# Patient Record
Sex: Male | Born: 1960 | Race: Black or African American | Hispanic: No | Marital: Married | Smoking: Never smoker
Health system: Southern US, Community
[De-identification: ages and names within clinical notes are randomized; demographics above are authoritative.]

## PROBLEM LIST (undated history)

## (undated) DIAGNOSIS — E119 Type 2 diabetes mellitus without complications: Secondary | ICD-10-CM

## (undated) DIAGNOSIS — E785 Hyperlipidemia, unspecified: Secondary | ICD-10-CM

## (undated) DIAGNOSIS — G4730 Sleep apnea, unspecified: Secondary | ICD-10-CM

## (undated) DIAGNOSIS — E039 Hypothyroidism, unspecified: Secondary | ICD-10-CM

## (undated) HISTORY — DX: Sleep apnea, unspecified: G47.30

## (undated) HISTORY — PX: TONSILECTOMY, ADENOIDECTOMY, BILATERAL MYRINGOTOMY AND TUBES: SHX2538

## (undated) HISTORY — DX: Hyperlipidemia, unspecified: E78.5

## (undated) HISTORY — DX: Type 2 diabetes mellitus without complications: E11.9

## (undated) HISTORY — DX: Hypothyroidism, unspecified: E03.9

---

## 1999-01-07 ENCOUNTER — Ambulatory Visit: Admission: RE | Admit: 1999-01-07 | Discharge: 1999-01-07 | Payer: Self-pay | Admitting: *Deleted

## 1999-05-25 ENCOUNTER — Observation Stay (HOSPITAL_COMMUNITY): Admission: RE | Admit: 1999-05-25 | Discharge: 1999-05-26 | Payer: Self-pay | Admitting: Otolaryngology

## 2006-10-20 ENCOUNTER — Emergency Department (HOSPITAL_COMMUNITY): Admission: EM | Admit: 2006-10-20 | Discharge: 2006-10-20 | Payer: Self-pay | Admitting: Emergency Medicine

## 2006-11-29 ENCOUNTER — Ambulatory Visit: Payer: Self-pay | Admitting: Cardiology

## 2011-11-25 ENCOUNTER — Ambulatory Visit (INDEPENDENT_AMBULATORY_CARE_PROVIDER_SITE_OTHER): Payer: BC Managed Care – PPO | Admitting: Internal Medicine

## 2011-11-25 VITALS — BP 120/79 | HR 67 | Temp 98.0°F | Resp 18 | Ht 66.5 in | Wt 216.2 lb

## 2011-11-25 DIAGNOSIS — H109 Unspecified conjunctivitis: Secondary | ICD-10-CM

## 2011-11-25 DIAGNOSIS — Z1211 Encounter for screening for malignant neoplasm of colon: Secondary | ICD-10-CM

## 2011-11-25 MED ORDER — OFLOXACIN 0.3 % OP SOLN: 1.0000 [drp] | Freq: Four times a day (QID) | OPHTHALMIC | Status: DC

## 2011-11-25 MED ORDER — OFLOXACIN 0.3 % OP SOLN: 1.0000 [drp] | Freq: Four times a day (QID) | OPHTHALMIC | Status: AC

## 2011-11-25 NOTE — Patient Instructions (Signed)
Two drops every 6 hours to both eyes for one week,.  Return for CPE in next 2 months, pt agrees.  Conjunctivitis Conjunctivitis is commonly called "pink eye." Conjunctivitis can be caused by bacterial or viral infection, allergies, or injuries. There is usually redness of the lining of the eye, itching, discomfort, and sometimes discharge. There may be deposits of matter along the eyelids. A viral infection usually causes a watery discharge, while a bacterial infection causes a yellowish, thick discharge. Pink eye is very contagious and spreads by direct contact. You may be given antibiotic eyedrops as part of your treatment. Before using your eye medicine, remove all drainage from the eye by washing gently with warm water and cotton balls. Continue to use the medication until you have awakened 2 mornings in a row without discharge from the eye. Do not rub your eye. This increases the irritation and helps spread infection. Use separate towels from other household members. Wash your hands with soap and water before and after touching your eyes. Use cold compresses to reduce pain and sunglasses to relieve irritation from light. Do not wear contact lenses or wear eye makeup until the infection is gone. SEEK MEDICAL CARE IF:   Your symptoms are not better after 3 days of treatment.   You have increased pain or trouble seeing.   The outer eyelids become very red or swollen.  Document Released: 10/12/2004 Document Revised: 08/24/2011 Document Reviewed: 09/04/2005 [facility] Patient Information 2012 [place], [place].

## 2011-11-25 NOTE — Progress Notes (Signed)
  Subjective:    Patient ID: Kevin Elliott, male    DOB: 15-Mar-1961, 51 y.o.   MRN: [id number]  Conjunctivitis  The current episode started 5 to 7 days ago. The onset was gradual. The problem is mild. The symptoms are relieved by nothing. Associated symptoms include eye itching, eye discharge and eye redness. Pertinent negatives include no decreased vision, no double vision, no congestion, no rhinorrhea, no sore throat and no eye pain.  Kevin Elliott is a newly 51 year old AA male here for pink eye for about a week.  Started in his left eye now his right eye is also having itching, redness, and increased discharge,  He has no other URI symptoms, sore throat, rhinitis, or sinus, ear pain.  He denies any trauma to the eye or vision changes.  Wears glasses for reading.  Works as a Careers information officer.  Wishes to establish a PCP and have screening colonoscopy done.  History of sleep apnea, denies other chronic disease.  Has seen Dr. Brunilda Payor for a prostate exam in the past.    Review of Systems  HENT: Negative for congestion, sore throat and rhinorrhea.   Eyes: Positive for discharge, redness and itching. Negative for double vision and pain.  All other systems reviewed and are negative.       Objective:   Physical Exam  Vitals reviewed. Constitutional: He appears well-developed and well-nourished.  HENT:  Head: Normocephalic.  Right Ear: External ear normal.  Left Ear: External ear normal.  Mouth/Throat: Oropharynx is clear and moist. No oropharyngeal exudate.       Left upper eyelid just slightly swollen.  Conjunctiva injected left more than right.  Right eye without swelling.  Eyes: Conjunctivae are normal.  Neck: Neck supple.  Cardiovascular: Normal rate, regular rhythm and normal heart sounds.   Pulmonary/Chest: Effort normal and breath sounds normal.  Musculoskeletal: He exhibits no edema.  Skin: Skin is warm and dry.  Psychiatric: He has a normal mood and affect. His behavior is normal.    Vision screening reviewed.        Assessment & Plan:  Conjunctivitis:  Ocuflox 2 drops QID for a week.  Screening colonoscopy referred to Dr. Elnoria Howard.  Pt agrees to rtc in 2 months for CPE.  AVS printed

## 2012-02-19 LAB — HM COLONOSCOPY

## 2012-08-10 ENCOUNTER — Ambulatory Visit: Payer: BC Managed Care – PPO

## 2012-08-10 ENCOUNTER — Ambulatory Visit (INDEPENDENT_AMBULATORY_CARE_PROVIDER_SITE_OTHER): Payer: BC Managed Care – PPO | Admitting: Emergency Medicine

## 2012-08-10 VITALS — BP 124/79 | HR 77 | Temp 97.9°F | Resp 18 | Wt 211.0 lb

## 2012-08-10 DIAGNOSIS — M545 Low back pain: Secondary | ICD-10-CM

## 2012-08-10 DIAGNOSIS — R202 Paresthesia of skin: Secondary | ICD-10-CM

## 2012-08-10 DIAGNOSIS — R209 Unspecified disturbances of skin sensation: Secondary | ICD-10-CM

## 2012-08-10 DIAGNOSIS — R200 Anesthesia of skin: Secondary | ICD-10-CM

## 2012-08-10 MED ORDER — HYDROCODONE-ACETAMINOPHEN 5-500 MG PO CAPS: 1.0000 | ORAL_CAPSULE | Freq: Four times a day (QID) | ORAL | Status: DC | PRN

## 2012-08-10 MED ORDER — PREDNISONE 20 MG PO TABS: ORAL_TABLET | ORAL | Status: DC

## 2012-08-10 NOTE — Patient Instructions (Addendum)
Sciatica Sciatica is pain, weakness, numbness, or tingling along the path of the sciatic nerve. The nerve starts in the lower back and runs down the back of each leg. The nerve controls the muscles in the lower leg and in the back of the knee, while also providing sensation to the back of the thigh, lower leg, and the sole of your foot. Sciatica is a symptom of another medical condition. For instance, nerve damage or certain conditions, such as a herniated disk or bone spur on the spine, pinch or put pressure on the sciatic nerve. This causes the pain, weakness, or other sensations normally associated with sciatica. Generally, sciatica only affects one side of the body. CAUSES   Herniated or slipped disc.  Degenerative disk disease.  A pain disorder involving the narrow muscle in the buttocks (piriformis syndrome).  Pelvic injury or fracture.  Pregnancy.  Tumor (rare). SYMPTOMS  Symptoms can vary from mild to very severe. The symptoms usually travel from the low back to the buttocks and down the back of the leg. Symptoms can include:  Mild tingling or dull aches in the lower back, leg, or hip.  Numbness in the back of the calf or sole of the foot.  Burning sensations in the lower back, leg, or hip.  Sharp pains in the lower back, leg, or hip.  Leg weakness.  Severe back pain inhibiting movement. These symptoms may get worse with coughing, sneezing, laughing, or prolonged sitting or standing. Also, being overweight may worsen symptoms. DIAGNOSIS  Your caregiver will perform a physical exam to look for common symptoms of sciatica. He or she may ask you to do certain movements or activities that would trigger sciatic nerve pain. Other tests may be performed to find the cause of the sciatica. These may include:  Blood tests.  X-rays.  Imaging tests, such as an MRI or CT scan. TREATMENT  Treatment is directed at the cause of the sciatic pain. Sometimes, treatment is not necessary  and the pain and discomfort goes away on its own. If treatment is needed, your caregiver may suggest:  Over-the-counter medicines to relieve pain.  Prescription medicines, such as anti-inflammatory medicine, muscle relaxants, or narcotics.  Applying heat or ice to the painful area.  Steroid injections to lessen pain, irritation, and inflammation around the nerve.  Reducing activity during periods of pain.  Exercising and stretching to strengthen your abdomen and improve flexibility of your spine. Your caregiver may suggest losing weight if the extra weight makes the back pain worse.  Physical therapy.  Surgery to eliminate what is pressing or pinching the nerve, such as a bone spur or part of a herniated disk. HOME CARE INSTRUCTIONS   Only take over-the-counter or prescription medicines for pain or discomfort as directed by your caregiver.  Apply ice to the affected area for 20 minutes, 3 4 times a day for the first 48 72 hours. Then try heat in the same way.  Exercise, stretch, or perform your usual activities if these do not aggravate your pain.  Attend physical therapy sessions as directed by your caregiver.  Keep all follow-up appointments as directed by your caregiver.  Do not wear high heels or shoes that do not provide proper support.  Check your mattress to see if it is too soft. A firm mattress may lessen your pain and discomfort. SEEK IMMEDIATE MEDICAL CARE IF:   You lose control of your bowel or bladder (incontinence).  You have increasing weakness in the lower back,   pelvis, buttocks, or legs.  You have redness or swelling of your back.  You have a burning sensation when you urinate.  You have pain that gets worse when you lie down or awakens you at night.  Your pain is worse than you have experienced in the past.  Your pain is lasting longer than 4 weeks.  You are suddenly losing weight without reason. MAKE SURE YOU:  Understand these  instructions.  Will watch your condition.  Will get help right away if you are not doing well or get worse. Document Released: 08/29/2001 Document Revised: 03/05/2012 Document Reviewed: 01/14/2012 [employer] Patient Information 2013 [employer].  

## 2012-08-10 NOTE — Progress Notes (Signed)
  Subjective:    Patient ID: Kevin Elliott, male    DOB: 1960/09/24, 51 y.o.   MRN: [id number]  HPI 51 y.o male presents with lower leg and right foot pain.  Wednesday was transferring 50lb concrete bags- building a fence.  Since this incidence has had numbness and pain on from right side buttock to foot.  Lateral pain with numbness and tingling,specifically 4th/5th digits.  Does feel strain when dong certain moves in back of thigh.  Only position that helps alleviate pain is lying supine with leg extended.     Review of Systems     Objective:   Physical Exam there is mild tenderness over lower lumbar spine. With the patient seated straight leg raising is present at about 60 on the right. Knee reflexes are 2+ and symmetrical the there is an absent right ankle reflex left ankle reflexes 1+. There is weakness noted on either urgent of the right foot compared to the left. There is no other focal weakness noted.  [facility] reading (PRIMARY) by  Dr. Cleta Alberts Narrow L5 S1 .       Assessment & Plan:  I suspect patient has a lumbar radiculopathy involving the S1 nerve root. We'll treat with prednisone taper dose as well as hydrocodone for pain. If patient calls plan to schedule an MRI without contrast to rule out a right S1 radiculopathy.

## 2012-11-26 ENCOUNTER — Encounter: Payer: BC Managed Care – PPO | Attending: Internal Medicine | Admitting: *Deleted

## 2012-11-26 DIAGNOSIS — E119 Type 2 diabetes mellitus without complications: Secondary | ICD-10-CM | POA: Insufficient documentation

## 2012-11-26 DIAGNOSIS — Z713 Dietary counseling and surveillance: Secondary | ICD-10-CM | POA: Insufficient documentation

## 2012-11-29 ENCOUNTER — Encounter: Payer: Self-pay | Admitting: *Deleted

## 2012-11-29 NOTE — Progress Notes (Signed)
Patient was seen on 11/26/2012 for the first of a series of three diabetes self-management courses at the [facility]. Patient's most recent A1c was 6.6 % on 10/07/12 The following learning objectives were met by the patient during this course:   Defines the role of glucose and insulin  Identifies type of diabetes and pathophysiology  Defines the diagnostic criteria for diabetes and prediabetes  States the risk factors for Type 2 Diabetes  States the symptoms of Type 2 Diabetes  Defines Type 2 Diabetes treatment goals  Defines Type 2 Diabetes treatment options  States the rationale for glucose monitoring  Identifies A1C, glucose targets, and testing times  Identifies proper sharps disposal  Defines the purpose of a diabetes food plan  Identifies carbohydrate food groups  Defines effects of carbohydrate foods on glucose levels  Identifies carbohydrate choices/grams/food labels  States benefits of physical activity and effect on glucose  Review of suggested activity guidelines  Handouts given during class include:  Type 2 Diabetes: Basics Book  My Food Plan Book  Food and Activity Log   Follow-Up Plan: Core Class 2

## 2012-11-29 NOTE — Patient Instructions (Signed)
Goals:  Follow Diabetes Meal Plan as instructed  Eat 3 meals and 2 snacks, every 3-5 hrs  Limit carbohydrate intake to 45-60 grams carbohydrate/meal  Limit carbohydrate intake to 30 grams carbohydrate/snack  Add lean protein foods to meals/snacks  Monitor glucose levels as instructed by your doctor  Aim for 30 mins of physical activity daily  Bring food record and glucose log to your next nutrition visit   

## 2012-12-03 ENCOUNTER — Ambulatory Visit: Payer: BC Managed Care – PPO

## 2012-12-17 ENCOUNTER — Ambulatory Visit: Payer: BC Managed Care – PPO

## 2012-12-19 ENCOUNTER — Encounter: Payer: BC Managed Care – PPO | Attending: Internal Medicine | Admitting: Dietician

## 2012-12-19 DIAGNOSIS — E119 Type 2 diabetes mellitus without complications: Secondary | ICD-10-CM

## 2012-12-19 DIAGNOSIS — Z713 Dietary counseling and surveillance: Secondary | ICD-10-CM | POA: Insufficient documentation

## 2012-12-21 NOTE — Progress Notes (Signed)
  Patient was seen on 12/19/2012 for the second of a series of three diabetes self-management courses at the [facility]. The following learning objectives were met by the patient during this course:   Explain basic nutrition maintenance and quality assurance  Describe causes, symptoms and treatment of hypoglycemia and hyperglycemia  Explain how to manage diabetes during illness  Describe the importance of good nutrition for health and healthy eating strategies  List strategies to follow meal plan when dining out  Describe the effects of alcohol on glucose and how to use it safely  Describe problem solving skills for day-to-day glucose challenges  Describe strategies to use when treatment plan needs to change  Identify important factors involved in successful weight loss  Describe ways to remain physically active  Describe the impact of regular activity on insulin resistance   Handouts given in class:  Refrigerator magnet for Sick Day Guidelines  [facility] Oral medication/insulin handout  Weight Loss Handout  Follow-Up Plan: Patient will attend the final class of the ADA Diabetes Self-Care Education.   

## 2013-01-02 ENCOUNTER — Encounter: Payer: BC Managed Care – PPO | Admitting: *Deleted

## 2013-01-02 DIAGNOSIS — E119 Type 2 diabetes mellitus without complications: Secondary | ICD-10-CM

## 2013-01-02 NOTE — Progress Notes (Signed)
  Patient was seen on 01/02/13 for the third of a series of three diabetes self-management courses at the [facility]. The following learning objectives were met by the patient during this course:    Describe how diabetes changes over time   Identify diabetes complications and ways to prevent them   Describe strategies that can promote heart health including lowering blood pressure and cholesterol   Describe strategies to lower dietary fat and sodium in the diet   Identify physical activities that benefit cardiovascular health   Evaluate success in meeting personal goal   Describe the belief that they can live successfully with diabetes day to day   Establish 2-3 goals that they will plan to diligently work on until they return for the free 54-month follow-up visit  The following handouts were given in class:  3 Month Follow Up Visit handout  Goal setting handout  Class evaluation form  Your patient has established the following 3 month goals for diabetes self-care:  Count carbohydrates at most meals and snacks   Follow-Up Plan: Patient will attend a 3 month follow-up visit for diabetes self-management education.

## 2013-04-25 ENCOUNTER — Ambulatory Visit: Payer: BC Managed Care – PPO | Admitting: *Deleted

## 2013-05-01 ENCOUNTER — Encounter: Payer: Self-pay | Admitting: *Deleted

## 2013-05-01 ENCOUNTER — Encounter: Payer: BC Managed Care – PPO | Attending: Internal Medicine | Admitting: *Deleted

## 2013-05-01 DIAGNOSIS — Z713 Dietary counseling and surveillance: Secondary | ICD-10-CM | POA: Insufficient documentation

## 2013-05-01 DIAGNOSIS — E119 Type 2 diabetes mellitus without complications: Secondary | ICD-10-CM | POA: Insufficient documentation

## 2013-05-01 NOTE — Progress Notes (Signed)
  Patient was seen on 05/01/2013 for their 3 month follow-up as a part of the diabetes self-management courses at the [facility]. The following learning objectives were met by your patient during this course:  Patient self reports the following:  Diabetes control has improved since diabetes self-management training: YES Number of days blood glucose is >200: NONE Last MD appointment for diabetes: April 23, 2013 Changes in treatment plan: ADDED LIPITOR FOR CHOLESTEROL Confidence with ability to manage diabetes: GOOD Areas for improvement with diabetes self-care: NO, HE IS ON TRACK WITH GOOD CONTROL NOW Willingness to participate in diabetes support group: NOT AT THIS TIME  Your patient has established the following 3 month goals for diabetes self-care:  Count carbohydrates at most meals and snacks - ACCOMPLISHED  Please see Diabetes Flow sheet for findings related to patient's self-care. Working out 3-4 times a week Has cut out pasta, pizza, regular sodas since class. Eating more vegetables and fresh fruits now.  Follow-Up Plan: Patient is eligible for a "free" 30 minute diabetes self-care appointment in the next year. Patient to call and schedule for February, 2015.

## 2013-10-30 ENCOUNTER — Ambulatory Visit: Payer: BC Managed Care – PPO | Admitting: *Deleted

## 2014-03-30 ENCOUNTER — Emergency Department (HOSPITAL_COMMUNITY)
Admission: EM | Admit: 2014-03-30 | Discharge: 2014-03-30 | Disposition: A | Discharge status: Home / Self Care | Payer: BC Managed Care – PPO | Attending: Emergency Medicine | Admitting: Emergency Medicine

## 2014-03-30 ENCOUNTER — Encounter (HOSPITAL_COMMUNITY): Payer: Self-pay | Admitting: Emergency Medicine

## 2014-03-30 ENCOUNTER — Emergency Department (HOSPITAL_COMMUNITY): Payer: BC Managed Care – PPO

## 2014-03-30 DIAGNOSIS — E785 Hyperlipidemia, unspecified: Secondary | ICD-10-CM | POA: Insufficient documentation

## 2014-03-30 DIAGNOSIS — E7800 Pure hypercholesterolemia, unspecified: Secondary | ICD-10-CM | POA: Insufficient documentation

## 2014-03-30 DIAGNOSIS — E119 Type 2 diabetes mellitus without complications: Secondary | ICD-10-CM | POA: Insufficient documentation

## 2014-03-30 DIAGNOSIS — Z79899 Other long term (current) drug therapy: Secondary | ICD-10-CM | POA: Insufficient documentation

## 2014-03-30 DIAGNOSIS — E079 Disorder of thyroid, unspecified: Secondary | ICD-10-CM | POA: Insufficient documentation

## 2014-03-30 DIAGNOSIS — Z8669 Personal history of other diseases of the nervous system and sense organs: Secondary | ICD-10-CM | POA: Insufficient documentation

## 2014-03-30 DIAGNOSIS — IMO0002 Reserved for concepts with insufficient information to code with codable children: Secondary | ICD-10-CM | POA: Insufficient documentation

## 2014-03-30 DIAGNOSIS — R0789 Other chest pain: Secondary | ICD-10-CM | POA: Insufficient documentation

## 2014-03-30 LAB — BASIC METABOLIC PANEL
ANION GAP: 11 (ref 5–15)
BUN: 13 mg/dL (ref 6–23)
CALCIUM: 9.5 mg/dL (ref 8.4–10.5)
CHLORIDE: 100 meq/L (ref 96–112)
CO2: 28 mEq/L (ref 19–32)
Creatinine, Ser: 1.19 mg/dL (ref 0.50–1.35)
GFR calc non Af Amer: 69 mL/min — ABNORMAL LOW (ref >90)
GFR, EST AFRICAN AMERICAN: 80 mL/min — AB (ref >90)
Glucose, Bld: 88 mg/dL (ref 70–99)
Potassium: 4 mEq/L (ref 3.7–5.3)
Sodium: 139 mEq/L (ref 137–147)

## 2014-03-30 LAB — I-STAT TROPONIN, ED: Troponin i, poc: 0 ng/mL (ref 0.00–0.08)

## 2014-03-30 LAB — CBC
HCT: 45.1 % (ref 39.0–52.0)
Hemoglobin: 15.6 g/dL (ref 13.0–17.0)
MCH: 29.7 pg (ref 26.0–34.0)
MCHC: 34.6 g/dL (ref 30.0–36.0)
MCV: 85.9 fL (ref 78.0–100.0)
PLATELETS: 180 10*3/uL (ref 150–400)
RBC: 5.25 MIL/uL (ref 4.22–5.81)
RDW: 13.5 % (ref 11.5–15.5)
WBC: 6.7 10*3/uL (ref 4.0–10.5)

## 2014-03-30 MED ORDER — METHOCARBAMOL 500 MG PO TABS: 1000.0000 mg | ORAL_TABLET | Freq: Four times a day (QID) | ORAL | Status: DC

## 2014-03-30 MED ORDER — IBUPROFEN 600 MG PO TABS: 600.0000 mg | ORAL_TABLET | Freq: Four times a day (QID) | ORAL | Status: DC | PRN

## 2014-03-30 NOTE — ED Provider Notes (Signed)
CSN: [id number]     Arrival date & time 03/30/14  1424 History   First MD Initiated Contact with Patient 03/30/14 1748     Chief Complaint  Patient presents with  . Chest Pain     (Consider location/radiation/quality/duration/timing/severity/associated sxs/prior Treatment) HPI Comments: Patients with past history of diabetes, high cholesterol, family history of MI -- presents with complaint of left upper chest pain that began at approximately 3 AM this morning. No radiation of pain. Patient first noticed when he got up to use the bathroom. Pain is made worse when he turns his head to the left. Pain is resolved when he is not moving. He denies nausea, vomiting. He denies shortness of breath or lightheadedness. No palpitations. Patient does light weight lifting exercises 2-3 times a week. No other strenuous activity or acute injury. No treatments prior to arrival. Patient was concerned about his heart given his family history.  The history is provided by the patient.    Past Medical History  Diagnosis Date  . Diabetes mellitus without complication   . Thyroid disease   . Sleep apnea   . Hyperlipidemia    Past Surgical History  Procedure Laterality Date  . Tonsilectomy, adenoidectomy, bilateral myringotomy and tubes     No family history on file. History  Substance Use Topics  . Smoking status: Never Smoker   . Smokeless tobacco: Not on file  . Alcohol Use: No    Review of Systems  Constitutional: Negative for fever and diaphoresis.  Eyes: Negative for redness.  Respiratory: Negative for cough and shortness of breath.   Cardiovascular: Positive for chest pain. Negative for palpitations and leg swelling.  Gastrointestinal: Negative for nausea, vomiting and abdominal pain.  Genitourinary: Negative for dysuria.  Musculoskeletal: Negative for back pain and neck pain.  Skin: Negative for rash.  Neurological: Negative for syncope and light-headedness.    Allergies  Review of  patient's allergies indicates no known allergies.  Home Medications   Prior to Admission medications   Medication Sig Start Date End Date Taking? Authorizing Provider  atorvastatin (LIPITOR) 10 MG tablet Take 10 mg by mouth daily.    Historical Provider, MD  hydrocodone-acetaminophen (LORCET-HD) 5-500 MG per capsule Take 1 capsule by mouth every 6 (six) hours as needed for pain. 08/10/12   Collene Gobble, MD  levothyroxine (SYNTHROID, LEVOTHROID) 88 MCG tablet Take 88 mcg by mouth daily.    Historical Provider, MD  predniSONE (DELTASONE) 20 MG tablet Take 3 a day for 3 days 2 a day for 3 days one a day for 3 days 08/10/12   Collene Gobble, MD  sitaGLIPtan-metformin (JANUMET) 50-500 MG per tablet Take 1 tablet by mouth 2 (two) times daily with a meal.    Historical Provider, MD   BP 115/75  Pulse 71  Temp(Src) 97.9 F (36.6 C) (Oral)  Resp 18  SpO2 100%  Physical Exam  Nursing note and vitals reviewed. Constitutional: He appears well-developed and well-nourished.  HENT:  Head: Normocephalic and atraumatic.  Mouth/Throat: Mucous membranes are normal. Mucous membranes are not dry.  Eyes: Conjunctivae are normal. Right eye exhibits no discharge. Left eye exhibits no discharge.  Neck: Trachea normal and normal range of motion. Neck supple. Normal carotid pulses and no JVD present. No muscular tenderness present. Carotid bruit is not present. No tracheal deviation present.  Cardiovascular: Normal rate, regular rhythm, S1 normal, S2 normal, normal heart sounds and intact distal pulses.  Exam reveals no distant heart sounds and no  decreased pulses.   No murmur heard. Pulmonary/Chest: Effort normal and breath sounds normal. No respiratory distress. He has no wheezes. He exhibits tenderness (L upper chest wall, reproduced with direct palpation of pectoralis and with leftward rotation of head).  Abdominal: Soft. Normal aorta and bowel sounds are normal. There is no tenderness. There is no rebound  and no guarding.  Musculoskeletal: He exhibits no edema.  Neurological: He is alert.  Skin: Skin is warm and dry. He is not diaphoretic. No cyanosis. No pallor.  Psychiatric: He has a normal mood and affect.    ED Course  Procedures (including critical care time) Labs Review Labs Reviewed  BASIC METABOLIC PANEL - Abnormal; Notable for the following:    GFR calc non Af Amer 69 (*)    GFR calc Af Amer 80 (*)    All other components within normal limits  CBC  I-STAT TROPOININ, ED    Imaging Review Dg Chest 2 View  03/30/2014   CLINICAL DATA:  Chest pain.  EXAM: CHEST  2 VIEW  COMPARISON:  None.  FINDINGS: The heart size and mediastinal contours are within normal limits. Both lungs are clear. No pneumothorax or pleural effusion is noted. The visualized skeletal structures are unremarkable.  IMPRESSION: No active cardiopulmonary disease.   Electronically Signed   By: Roque LiasJames  Green M.D.   On: 03/30/2014 15:15     EKG Interpretation None      6:17 PM Patient seen and examined. Work-up reviewed with patient. Fell patient safe for d/c to home with NSAID and muscle relaxer.   Vital signs reviewed and are as follows: Filed Vitals:   03/30/14 1714  BP: 115/75  Pulse: 71  Temp:   Resp: 18   6:52 PM Patient findings and history reviewed with Dr. Rhunette CroftNanavati. OK for d/c to home. Pt states he has PCP f/u in 3 weeks, will call office tomorrow.   Patient was counseled to return with severe chest pain, especially if the pain is crushing or pressure-like and spreads to the arms, back, neck, or jaw, or if they have sweating, nausea, or shortness of breath with the pain. They were encouraged to call 911 with these symptoms.   They were also told to return if their chest pain gets worse and does not go away with rest, they have an attack of chest pain lasting longer than usual despite rest and treatment with the medications their caregiver has prescribed, if they wake from sleep with chest pain or  shortness of breath, if they feel dizzy or faint, if they have chest pain not typical of their usual pain, or if they have any other emergent concerns regarding their health.  The patient verbalized understanding and agreed.    MDM   Final diagnoses:  Musculoskeletal chest pain   Patient with chest tightness that is obviously reproducible as above. Feel patient is low risk for ACS given history (poor story for ACS/MI), negative troponin, normal/unchanged EKG. This is chest wall pain. No concern for PE, dissection. Pt would benefit with close PCP f/u given strong family history and risk factor profile.     Renne CriglerJoshua Ole Lafon, PA-C 03/30/14 [phone]

## 2014-03-30 NOTE — Discharge Instructions (Signed)
Please read and follow all provided instructions.  Your diagnoses today include:  1. Musculoskeletal chest pain     Tests performed today include:  An EKG of your heart - unchanged  A chest x-ray - normal  Cardiac enzymes - a blood test for heart muscle damage, normal  Blood counts and electrolytes  Vital signs. See below for your results today.   Medications prescribed:   Ibuprofen (Motrin, Advil) - anti-inflammatory pain medication  Do not exceed 600mg  ibuprofen every 6 hours, take with food  You have been prescribed an anti-inflammatory medication or NSAID. Take with food. Take smallest effective dose for the shortest duration needed for your pain. Stop taking if you experience stomach pain or vomiting.    Robaxin (methocarbamol) - muscle relaxer medication  DO NOT drive or perform any activities that require you to be awake and alert because this medicine can make you drowsy.   Take any prescribed medications only as directed.  Follow-up instructions: Please follow-up with your primary care provider as soon as you can for further evaluation of your symptoms.   Return instructions:  SEEK IMMEDIATE MEDICAL ATTENTION IF:  You have severe chest pain, especially if the pain is crushing or pressure-like and spreads to the arms, back, neck, or jaw, or if you have sweating, nausea (feeling sick to your stomach), or shortness of breath. THIS IS AN EMERGENCY. Don't wait to see if the pain will go away. Get medical help at once. Call 911 or [phone] (operator). DO NOT drive yourself to the hospital.   Your chest pain gets worse and does not go away with rest.   You have an attack of chest pain lasting longer than usual, despite rest and treatment with the medications your caregiver has prescribed.   You wake from sleep with chest pain or shortness of breath.  You feel dizzy or faint.  You have chest pain not typical of your usual pain for which you originally saw your caregiver.     You have any other emergent concerns regarding your health.  Additional Information: Chest pain comes from many different causes. Your caregiver has diagnosed you as having chest pain that is not specific for one problem, but does not require admission.  You are at low risk for an acute heart condition or other serious illness.   Your vital signs today were: BP 128/77   Pulse 72   Temp(Src) 97.9 F (36.6 C) (Oral)   Resp 18   SpO2 97% If your blood pressure (BP) was elevated above 135/85 this visit, please have this repeated by your doctor within one month. --------------

## 2014-03-30 NOTE — ED Notes (Signed)
PA student at bedside.

## 2014-03-30 NOTE — ED Notes (Signed)
Patient states he started having reproducible L chest pain when he turns head or moves upper body.  Patient denies any other symptoms at this time.

## 2014-03-31 NOTE — ED Provider Notes (Signed)
Medical screening examination/treatment/procedure(s) were performed by non-physician practitioner and as supervising physician I was immediately available for consultation/collaboration.   EKG Interpretation   Date/Time:  Monday March 30 2014 14:27:28 EDT Ventricular Rate:  90 PR Interval:  156 QRS Duration: 82 QT Interval:  342 QTC Calculation: 418 R Axis:   0 Text Interpretation:  Normal sinus rhythm Indeterminate axis Borderline  ECG Confirmed by Rhunette CroftNANAVATI, MD, Janey GentaANKIT [phone]([id number]) on 03/30/2014 6:49:11 PM       Derwood KaplanAnkit Telia Amundson, MD 03/31/14 0131

## 2014-11-10 ENCOUNTER — Ambulatory Visit: Payer: BC Managed Care – PPO | Admitting: Internal Medicine

## 2014-11-10 ENCOUNTER — Encounter: Payer: Self-pay | Admitting: Internal Medicine

## 2014-11-10 ENCOUNTER — Ambulatory Visit (INDEPENDENT_AMBULATORY_CARE_PROVIDER_SITE_OTHER): Payer: BC Managed Care – PPO | Admitting: Internal Medicine

## 2014-11-10 VITALS — BP 128/82 | HR 74 | Temp 98.5°F | Ht 65.0 in | Wt 205.5 lb

## 2014-11-10 DIAGNOSIS — E785 Hyperlipidemia, unspecified: Secondary | ICD-10-CM

## 2014-11-10 DIAGNOSIS — E119 Type 2 diabetes mellitus without complications: Secondary | ICD-10-CM

## 2014-11-10 DIAGNOSIS — E039 Hypothyroidism, unspecified: Secondary | ICD-10-CM

## 2014-11-10 LAB — COMPREHENSIVE METABOLIC PANEL
ALK PHOS: 49 U/L (ref 39–117)
ALT: 24 U/L (ref 0–53)
AST: 27 U/L (ref 0–37)
Albumin: 4.2 g/dL (ref 3.5–5.2)
BUN: 11 mg/dL (ref 6–23)
CO2: 31 meq/L (ref 19–32)
CREATININE: 1.15 mg/dL (ref 0.40–1.50)
Calcium: 9.7 mg/dL (ref 8.4–10.5)
Chloride: 102 mEq/L (ref 96–112)
GFR: 85.51 mL/min (ref >60.00)
Glucose, Bld: 89 mg/dL (ref 70–99)
Potassium: 4.1 mEq/L (ref 3.5–5.1)
Sodium: 137 mEq/L (ref 135–145)
TOTAL PROTEIN: 7.3 g/dL (ref 6.0–8.3)
Total Bilirubin: 0.4 mg/dL (ref 0.2–1.2)

## 2014-11-10 LAB — CBC WITH DIFFERENTIAL/PLATELET
BASOS PCT: 0.8 % (ref 0.0–3.0)
Basophils Absolute: 0 10*3/uL (ref 0.0–0.1)
Eosinophils Absolute: 0.1 10*3/uL (ref 0.0–0.7)
Eosinophils Relative: 1.5 % (ref 0.0–5.0)
HEMATOCRIT: 46.8 % (ref 39.0–52.0)
HEMOGLOBIN: 15.8 g/dL (ref 13.0–17.0)
Lymphocytes Relative: 56 % — ABNORMAL HIGH (ref 12.0–46.0)
Lymphs Abs: 2.5 10*3/uL (ref 0.7–4.0)
MCHC: 33.8 g/dL (ref 30.0–36.0)
MCV: 88.1 fl (ref 78.0–100.0)
MONO ABS: 0.4 10*3/uL (ref 0.1–1.0)
Monocytes Relative: 8.3 % (ref 3.0–12.0)
NEUTROS ABS: 1.5 10*3/uL (ref 1.4–7.7)
Neutrophils Relative %: 33.4 % — ABNORMAL LOW (ref 43.0–77.0)
Platelets: 196 10*3/uL (ref 150.0–400.0)
RBC: 5.32 Mil/uL (ref 4.22–5.81)
RDW: 14.3 % (ref 11.5–15.5)
WBC: 4.5 10*3/uL (ref 4.0–10.5)

## 2014-11-10 LAB — TSH: TSH: 2.19 u[IU]/mL (ref 0.35–4.50)

## 2014-11-10 LAB — LIPID PANEL
CHOL/HDL RATIO: 4
CHOLESTEROL: 189 mg/dL (ref 0–200)
HDL: 45.4 mg/dL (ref >39.00)
LDL CALC: 129 mg/dL — AB (ref 0–99)
NonHDL: 143.6
Triglycerides: 75 mg/dL (ref 0.0–149.0)
VLDL: 15 mg/dL (ref 0.0–40.0)

## 2014-11-10 LAB — MICROALBUMIN / CREATININE URINE RATIO
Creatinine,U: 102.2 mg/dL
Microalb Creat Ratio: 0.7 mg/g (ref 0.0–30.0)
Microalb, Ur: <0.7 mg/dL (ref 0.0–1.9)

## 2014-11-10 LAB — T4, FREE: FREE T4: 1.01 ng/dL (ref 0.60–1.60)

## 2014-11-10 LAB — HEMOGLOBIN A1C: Hgb A1c MFr Bld: 6.1 % (ref 4.6–6.5)

## 2014-11-10 LAB — HM DIABETES FOOT EXAM

## 2014-11-10 MED ORDER — SITAGLIPTIN PHOS-METFORMIN HCL 50-500 MG PO TABS: 1.0000 | ORAL_TABLET | Freq: Two times a day (BID) | ORAL | Status: DC

## 2014-11-10 MED ORDER — LEVOTHYROXINE SODIUM 100 MCG PO TABS: 100.0000 ug | ORAL_TABLET | Freq: Every day | ORAL | Status: DC

## 2014-11-10 NOTE — Assessment & Plan Note (Signed)
Will check labs Urine microal--ACEI if positive Discussed fitness Due for eye exam

## 2014-11-10 NOTE — Progress Notes (Signed)
Pre visit review using our clinic review tool, if applicable. No additional management support is needed unless otherwise documented below in the visit note. 

## 2014-11-10 NOTE — Assessment & Plan Note (Signed)
Mildly elevated in past Doesn't know recent values Discussed statin and recommended---he wants to consider for now

## 2014-11-10 NOTE — Assessment & Plan Note (Signed)
Seems to be euthyroid Will check labs 

## 2014-11-10 NOTE — Progress Notes (Signed)
Subjective:    Patient ID: Kevin Elliott, male    DOB: [birth year], 54 y.o.   MRN: [id number]  HPI  Here to establish care This is closer to his home Had been seeing Dr Gwenette Greetobyn [place] in [place]  Diabetes diagnosed about 2 years Has had diabetic counseling Pretty good with proper eating Overall weight is down from when he was diagnosed Tries to walk or do other exercise as much as possible Keeps up with eye exams--[facility]. appt soon No extremity numbness or pain  Reviewed cholesterol Baseline was 207 Not on meds for this Not excited about starting medication for this  Diagnosed with hypothyroidism On meds for 10 years or so Feels that his replacement is adequate at present  Current Outpatient Prescriptions on File Prior to Visit  Medication Sig Dispense Refill  . levothyroxine (SYNTHROID, LEVOTHROID) 100 MCG tablet Take 100 mcg by mouth daily before breakfast.    . sitaGLIPtan-metformin (JANUMET) 50-500 MG per tablet Take 1 tablet by mouth 2 (two) times daily with a meal.     No current facility-administered medications on file prior to visit.    No Known Allergies  Past Medical History  Diagnosis Date  . Diabetes mellitus type 2, controlled, without complications   . Hypothyroidism   . Sleep apnea   . Hyperlipidemia     Past Surgical History  Procedure Laterality Date  . Tonsilectomy, adenoidectomy, bilateral myringotomy and tubes      Family History  Problem Relation Age of Onset  . Hypertension Mother   . Gout Mother   . Cancer Father   . COPD Father   . Heart disease Brother     just his oldest brother  . Diabetes Neg Hx     History   Social History  . Marital Status: Married    Spouse Name: N/A  . Number of Children: 4  . Years of Education: N/A   Occupational History  . 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 Smoking status: Never Smoker   . Smokeless tobacco: Never Used  . Alcohol Use: No  . Drug Use: No    . Sexual Activity: Not on file   Other Topics Concern  . Not on file   Social History Narrative   Review of Systems  Constitutional: Negative for fatigue and unexpected weight change.  HENT: Negative for dental problem and hearing loss.   Eyes: Negative for visual disturbance.  Respiratory: Negative for cough, chest tightness and shortness of breath.   Cardiovascular: Negative for chest pain, palpitations and leg swelling.  Gastrointestinal: Negative for nausea, vomiting, abdominal pain, constipation and blood in stool.       No heartburn   Endocrine: Negative for polydipsia and polyuria.  Genitourinary: Negative for urgency and frequency.       Nocturia x 1 No daytime problems  Musculoskeletal: Negative for back pain, joint swelling and arthralgias.  Skin: Negative for rash.  Neurological: Negative for dizziness, syncope, weakness, light-headedness and numbness.  Hematological: Negative for adenopathy. Does not bruise/bleed easily.  Psychiatric/Behavioral: Negative for sleep disturbance and dysphoric mood. The patient is not nervous/anxious.        Snoring improved after weight loss       Objective:   Physical Exam  Constitutional: He is oriented to person, place, and time. He appears well-developed and well-nourished. No distress.  HENT:  Head: Normocephalic and atraumatic.  Right Ear: External ear normal.  Left Ear:  External ear normal.  Mouth/Throat: Oropharynx is clear and moist. No oropharyngeal exudate.  Eyes: Conjunctivae and EOM are normal. Pupils are equal, round, and reactive to light.  Neck: Normal range of motion. Neck supple. No thyromegaly present.  Cardiovascular: Normal rate, regular rhythm, normal heart sounds and intact distal pulses.  Exam reveals no gallop.   No murmur heard. Pulmonary/Chest: Effort normal and breath sounds normal. No respiratory distress. He has no wheezes. He has no rales.  Abdominal: Soft. There is no tenderness.  Musculoskeletal:  He exhibits no edema or tenderness.  Lymphadenopathy:    He has no cervical adenopathy.  Neurological: He is alert and oriented to person, place, and time.  Normal sensation on plantar feet No foot lesions  Skin: No rash noted. No erythema.  Psychiatric: He has a normal mood and affect. His behavior is normal.          Assessment & Plan:

## 2014-11-13 ENCOUNTER — Encounter: Payer: Self-pay | Admitting: Internal Medicine

## 2014-11-28 LAB — HM DIABETES EYE EXAM

## 2014-12-02 IMAGING — CR DG CHEST 2V
2 series · 2 of 2 positions shown · non-contrast
Comparison: None.

CLINICAL DATA: Chest pain.

EXAM:
CHEST  2 VIEW

[w chest pa]
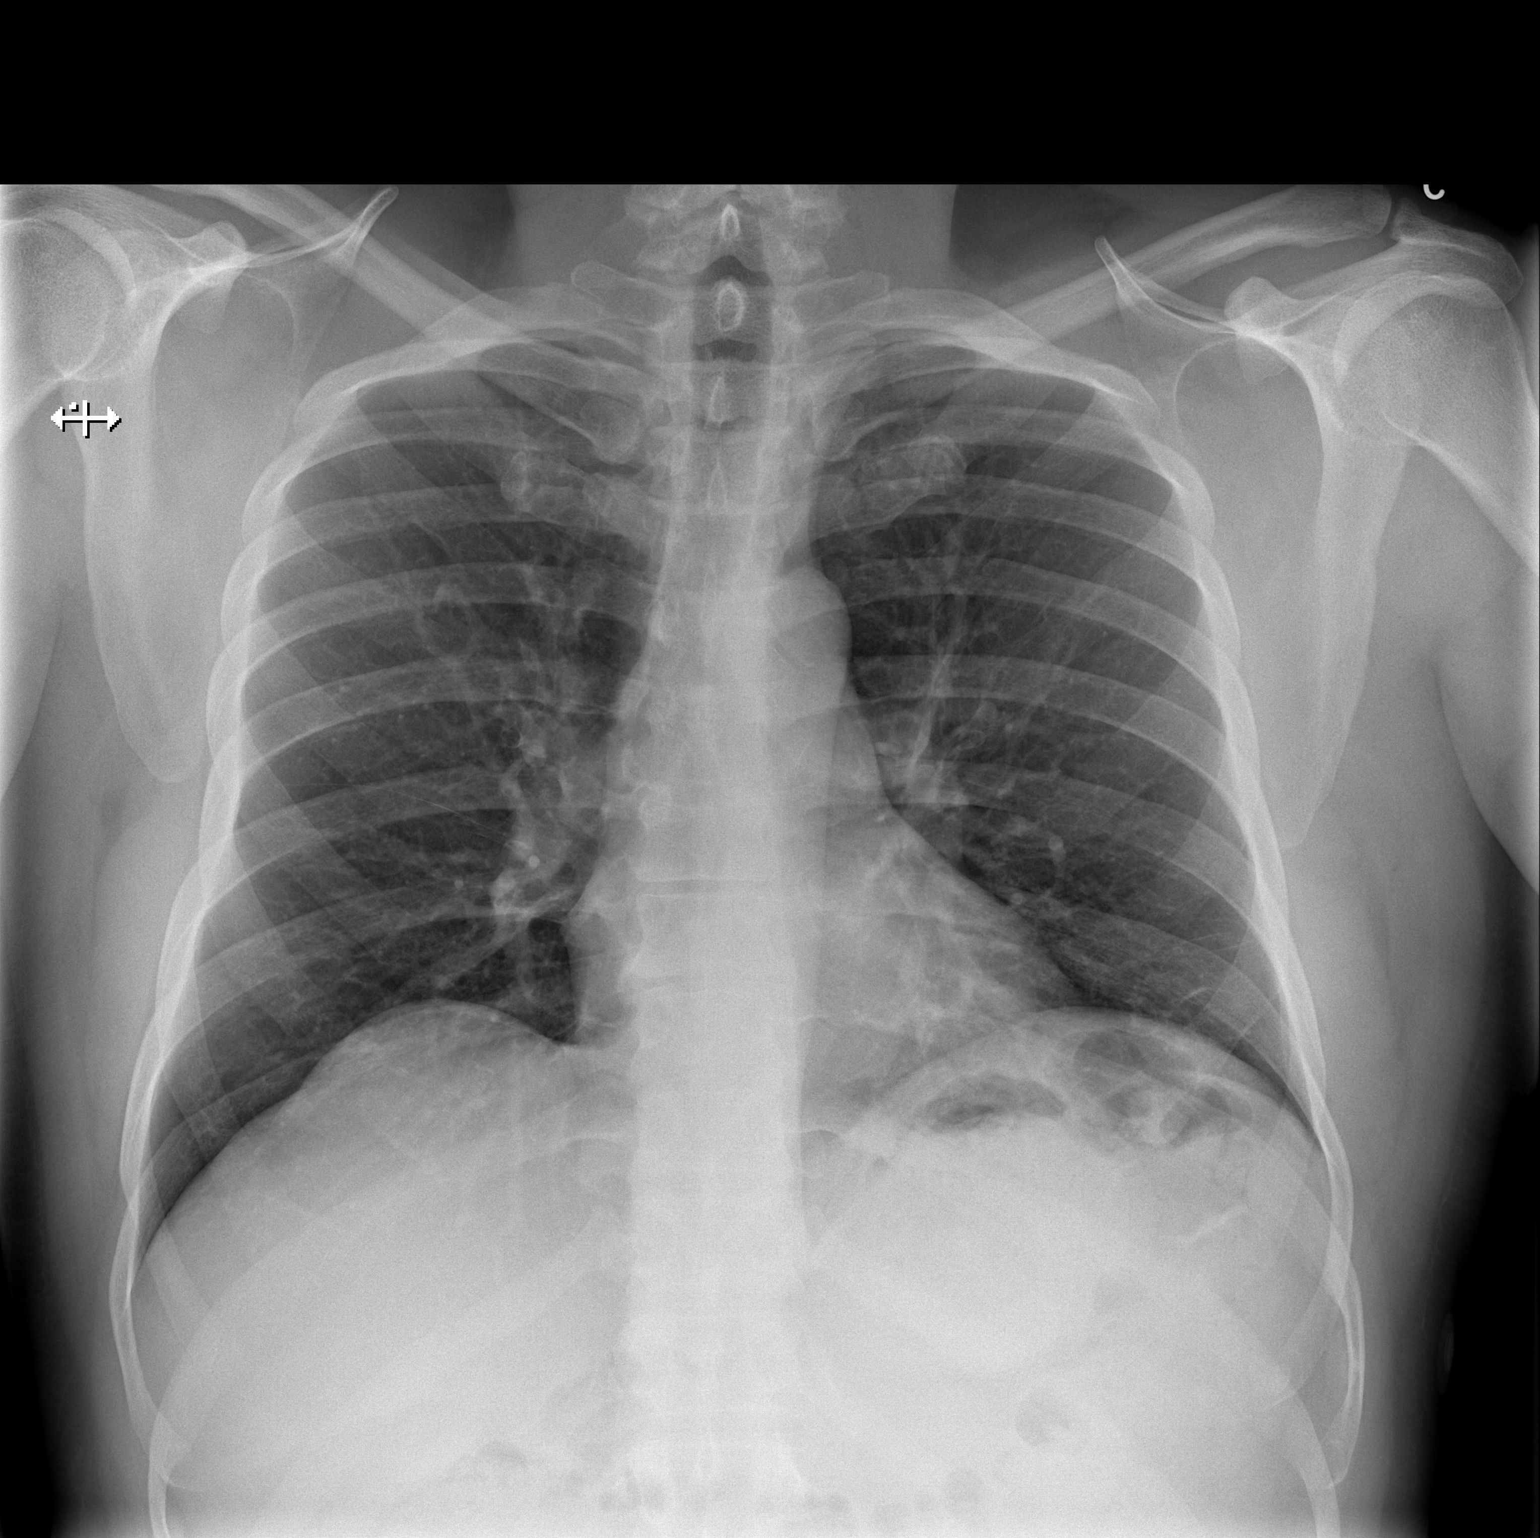

[w chest lat]
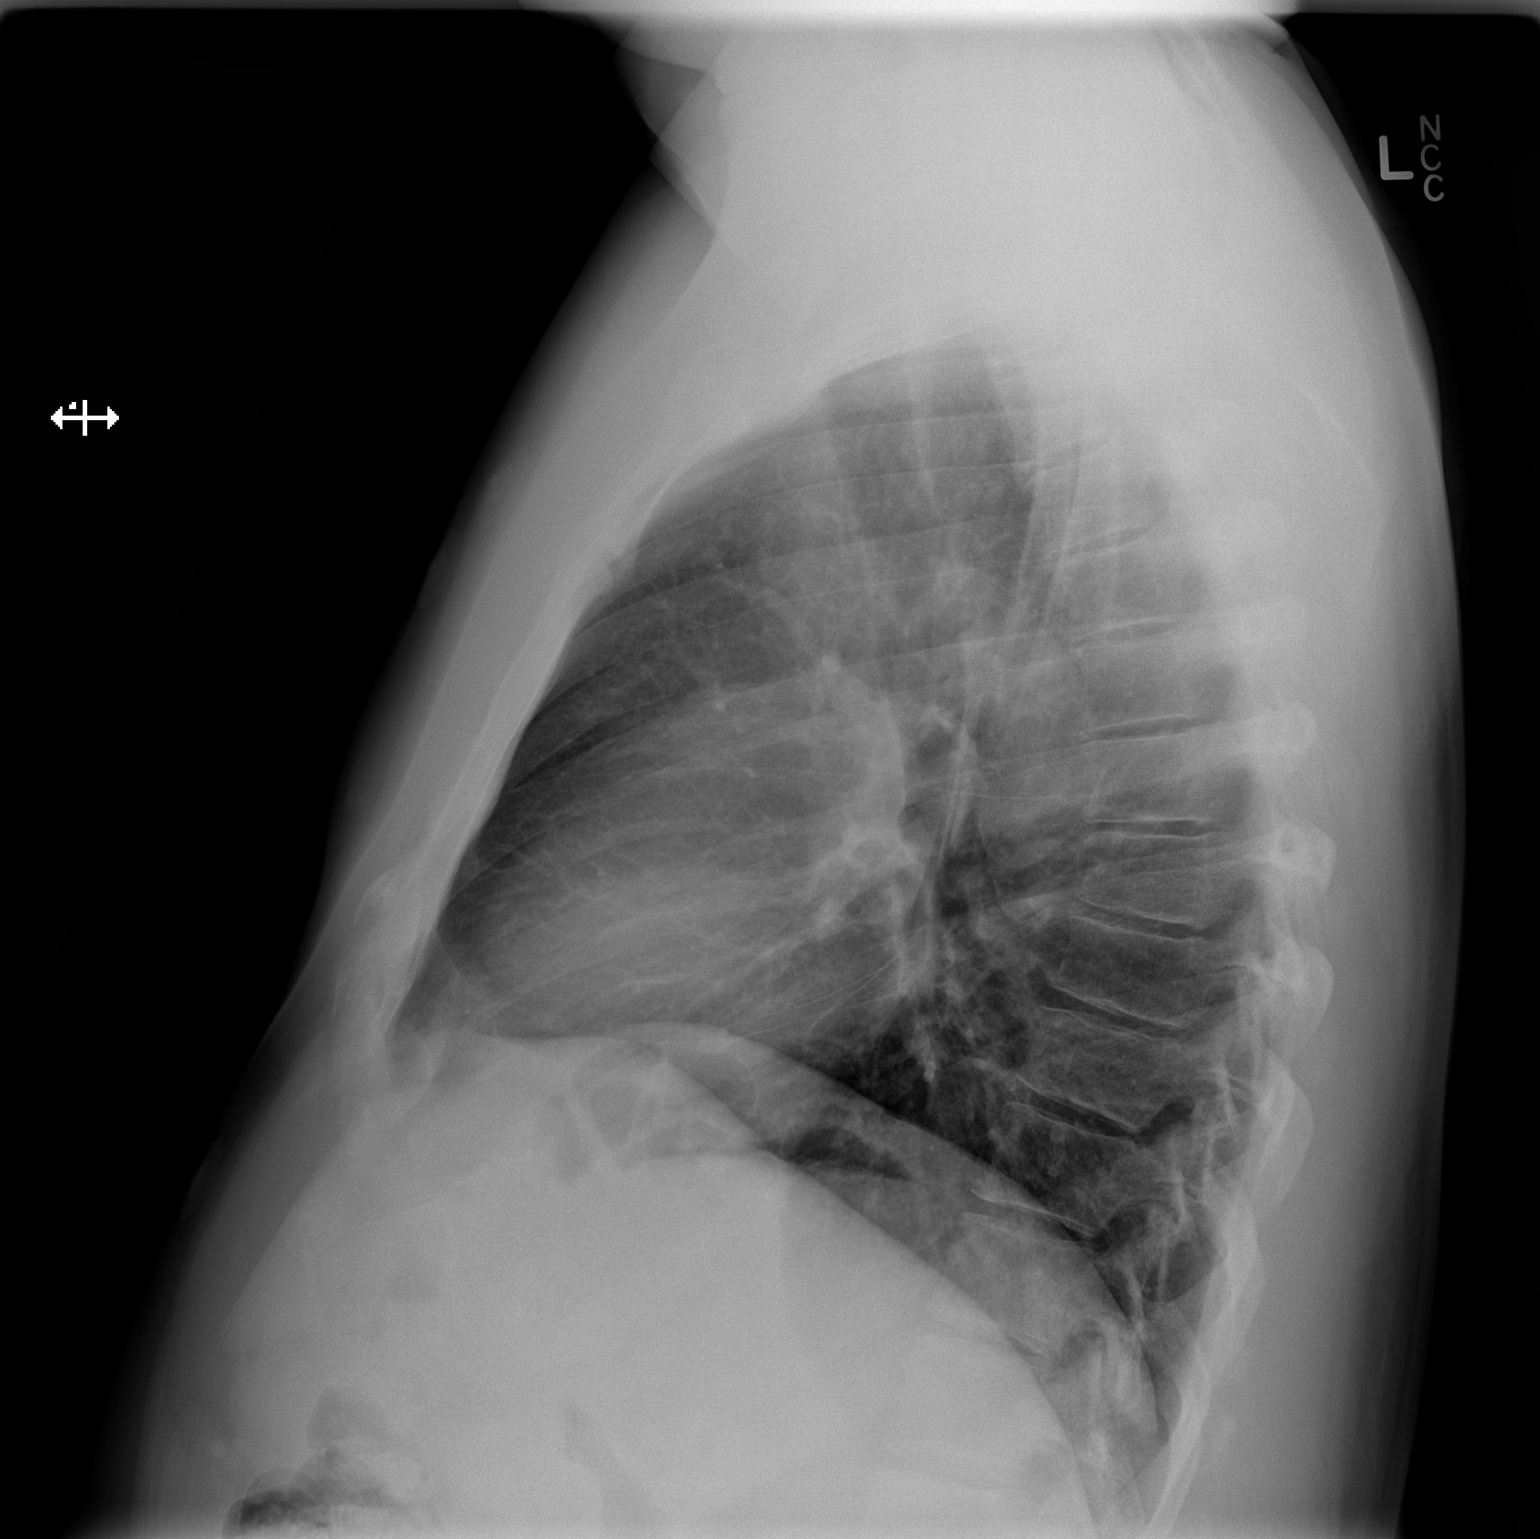

[2 of 2 positions shown; findings below may reference images not displayed]

FINDINGS: The heart size and mediastinal contours are within normal limits.
Both lungs are clear. No pneumothorax or pleural effusion is noted.
The visualized skeletal structures are unremarkable.
IMPRESSION: No active cardiopulmonary disease.

## 2015-05-13 ENCOUNTER — Encounter: Payer: Self-pay | Admitting: Internal Medicine

## 2015-05-13 ENCOUNTER — Ambulatory Visit (INDEPENDENT_AMBULATORY_CARE_PROVIDER_SITE_OTHER): Payer: BC Managed Care – PPO | Admitting: Internal Medicine

## 2015-05-13 VITALS — BP 130/70 | HR 78 | Temp 98.0°F | Ht 65.0 in | Wt 204.0 lb

## 2015-05-13 DIAGNOSIS — E785 Hyperlipidemia, unspecified: Secondary | ICD-10-CM

## 2015-05-13 DIAGNOSIS — Z0000 Encounter for general adult medical examination without abnormal findings: Secondary | ICD-10-CM

## 2015-05-13 DIAGNOSIS — Z23 Encounter for immunization: Secondary | ICD-10-CM | POA: Diagnosis not present

## 2015-05-13 DIAGNOSIS — E119 Type 2 diabetes mellitus without complications: Secondary | ICD-10-CM | POA: Diagnosis not present

## 2015-05-13 LAB — HM DIABETES FOOT EXAM

## 2015-05-13 MED ORDER — ATORVASTATIN CALCIUM 10 MG PO TABS: 10.0000 mg | ORAL_TABLET | Freq: Every day | ORAL | Status: DC

## 2015-05-13 NOTE — Progress Notes (Signed)
Pre visit review using our clinic review tool, if applicable. No additional management support is needed unless otherwise documented below in the visit note. 

## 2015-05-13 NOTE — Addendum Note (Signed)
Addended by: Sueanne Margarita on: 05/13/2015 04:51 PM   Modules accepted: Orders

## 2015-05-13 NOTE — Assessment & Plan Note (Signed)
He is reluctant to try medication Will try low dose atorvastatin

## 2015-05-13 NOTE — Assessment & Plan Note (Signed)
Good control  No changes needed

## 2015-05-13 NOTE — Progress Notes (Signed)
Subjective:    Patient ID: Kevin Elliott, male    DOB: [birth year], 54 y.o.   MRN: [id number]  HPI Here for physical   Checks sugars every morning Range is 78-85 fasting No hypoglycemic reactions Lost 2# since last visit Tries to walk regularly and some weight work No numbness or pain in feet Eye exam in March  Still on thyroid med  Current Outpatient Prescriptions on File Prior to Visit  Medication Sig Dispense Refill  . Cholecalciferol (VITAMIN D) 2000 UNITS CAPS Take by mouth. Take 2 by mouth daily    . levothyroxine (SYNTHROID, LEVOTHROID) 100 MCG tablet Take 1 tablet (100 mcg total) by mouth daily before breakfast. 90 tablet 3  . sitaGLIPtin-metformin (JANUMET) 50-500 MG per tablet Take 1 tablet by mouth 2 (two) times daily with a meal. 180 tablet 3   No current facility-administered medications on file prior to visit.    No Known Allergies  Past Medical History  Diagnosis Date  . Diabetes mellitus type 2, controlled, without complications   . Hypothyroidism   . Sleep apnea   . Hyperlipidemia     Past Surgical History  Procedure Laterality Date  . Tonsilectomy, adenoidectomy, bilateral myringotomy and tubes      Family History  Problem Relation Age of Onset  . Hypertension Mother   . Gout Mother   . Cancer Father   . COPD Father   . Heart disease Brother     just his oldest brother  . Diabetes Neg Hx     Social History   Social History  . Marital Status: Married    Spouse Name: N/A  . Number of Children: 4  . Years of Education: N/A   Occupational History  . [employer] examiner     Retired   Social History Main Topics  . Smoking status: Never Smoker   . Smokeless tobacco: Never Used  . Alcohol Use: No  . Drug Use: No  . Sexual Activity: Not on file   Other Topics Concern  . Not on file   Social History Narrative   Review of Systems  Constitutional: Negative for fatigue and unexpected weight change.       Wears seat belt  HENT:  Negative for dental problem, hearing loss and tinnitus.        Keeps up with dentist  Eyes: Negative for visual disturbance.       No diplopia or unilateral vision loss  Respiratory: Negative for cough, chest tightness and shortness of breath.   Cardiovascular: Negative for chest pain, palpitations and leg swelling.  Gastrointestinal: Negative for nausea, vomiting, abdominal pain, constipation and blood in stool.       No heartburn  Endocrine: Negative for polydipsia and polyuria.  Genitourinary: Positive for difficulty urinating.       Some decreased stream No sexual problems  Musculoskeletal: Negative for back pain, joint swelling and arthralgias.  Skin: Negative for rash.       No suspicious lesions  Allergic/Immunologic: Negative for environmental allergies and immunocompromised state.  Neurological: Negative for dizziness, syncope, weakness, light-headedness, numbness and headaches.  Hematological: Negative for adenopathy. Does not bruise/bleed easily.  Psychiatric/Behavioral: Negative for sleep disturbance and dysphoric mood. The patient is not nervous/anxious.        Objective:   Physical Exam  Constitutional: He is oriented to person, place, and time. He appears well-developed and well-nourished. No distress.  HENT:  Head: Normocephalic and atraumatic.  Right Ear: External ear normal.  Left Ear: External ear normal.  Mouth/Throat: Oropharynx is clear and moist. No oropharyngeal exudate.  Eyes: Conjunctivae and EOM are normal. Pupils are equal, round, and reactive to light.  Neck: Normal range of motion. Neck supple. No thyromegaly present.  Cardiovascular: Normal rate, regular rhythm, normal heart sounds and intact distal pulses.  Exam reveals no gallop.   No murmur heard. Pulmonary/Chest: Effort normal and breath sounds normal. No respiratory distress. He has no wheezes. He has no rales.  Abdominal: There is no tenderness.  Musculoskeletal: He exhibits no edema or  tenderness.  Lymphadenopathy:    He has no cervical adenopathy.  Neurological: He is alert and oriented to person, place, and time.  Normal sensation in feet  Skin: No rash noted. No erythema.  No foot lesions  Psychiatric: He has a normal mood and affect. His behavior is normal.          Assessment & Plan:

## 2015-05-13 NOTE — Assessment & Plan Note (Signed)
Healthy Will check PSA after check Colon due 2018 Flu/prevnar

## 2015-06-25 ENCOUNTER — Other Ambulatory Visit: Payer: BC Managed Care – PPO

## 2015-07-02 ENCOUNTER — Other Ambulatory Visit (INDEPENDENT_AMBULATORY_CARE_PROVIDER_SITE_OTHER): Payer: BC Managed Care – PPO

## 2015-07-02 DIAGNOSIS — E119 Type 2 diabetes mellitus without complications: Secondary | ICD-10-CM | POA: Diagnosis not present

## 2015-07-02 DIAGNOSIS — E785 Hyperlipidemia, unspecified: Secondary | ICD-10-CM | POA: Diagnosis not present

## 2015-07-02 LAB — HEMOGLOBIN A1C: Hgb A1c MFr Bld: 6.1 % (ref 4.6–6.5)

## 2015-07-02 LAB — HEPATIC FUNCTION PANEL
ALBUMIN: 4.1 g/dL (ref 3.5–5.2)
ALT: 26 U/L (ref 0–53)
AST: 26 U/L (ref 0–37)
Alkaline Phosphatase: 50 U/L (ref 39–117)
Bilirubin, Direct: 0.2 mg/dL (ref 0.0–0.3)
Total Bilirubin: 0.8 mg/dL (ref 0.2–1.2)
Total Protein: 7.2 g/dL (ref 6.0–8.3)

## 2015-07-02 LAB — LIPID PANEL
Cholesterol: 118 mg/dL (ref 0–200)
HDL: 37.3 mg/dL — AB (ref >39.00)
LDL Cholesterol: 70 mg/dL (ref 0–99)
NONHDL: 80.81
Total CHOL/HDL Ratio: 3
Triglycerides: 52 mg/dL (ref 0.0–149.0)
VLDL: 10.4 mg/dL (ref 0.0–40.0)

## 2015-11-15 ENCOUNTER — Ambulatory Visit (INDEPENDENT_AMBULATORY_CARE_PROVIDER_SITE_OTHER): Payer: BC Managed Care – PPO | Admitting: Internal Medicine

## 2015-11-15 ENCOUNTER — Encounter: Payer: Self-pay | Admitting: Internal Medicine

## 2015-11-15 VITALS — BP 100/70 | HR 97 | Temp 97.4°F | Wt 197.0 lb

## 2015-11-15 DIAGNOSIS — E785 Hyperlipidemia, unspecified: Secondary | ICD-10-CM | POA: Diagnosis not present

## 2015-11-15 DIAGNOSIS — E119 Type 2 diabetes mellitus without complications: Secondary | ICD-10-CM | POA: Diagnosis not present

## 2015-11-15 NOTE — Assessment & Plan Note (Signed)
Tolerating statin without problems

## 2015-11-15 NOTE — Progress Notes (Signed)
   Subjective:    Patient ID: Kevin Elliott, male    DOB: [birth year], 55 y.o.   MRN: [id number]  HPI Here for follow up of diabetes and other medical conditions  Doing well Checks sugars 3-4 times a week--fasting Usually 70-85 No hypoglycemic reactions No sores, numbness or pain in feet  No chest pain No dizziness or syncope Breathing is good Exercises regularly at [facility]--- treadmill and walks track  Current Outpatient Prescriptions on File Prior to Visit  Medication Sig Dispense Refill  . atorvastatin (LIPITOR) 10 MG tablet Take 1 tablet (10 mg total) by mouth daily. 90 tablet 3  . Cholecalciferol (VITAMIN D) 2000 UNITS CAPS Take by mouth. Take 2 by mouth daily    . levothyroxine (SYNTHROID, LEVOTHROID) 100 MCG tablet Take 1 tablet (100 mcg total) by mouth daily before breakfast. 90 tablet 3  . sitaGLIPtin-metformin (JANUMET) 50-500 MG per tablet Take 1 tablet by mouth 2 (two) times daily with a meal. 180 tablet 3   No current facility-administered medications on file prior to visit.    No Known Allergies  Past Medical History  Diagnosis Date  . Diabetes mellitus type 2, controlled, without complications (HCC)   . Hypothyroidism   . Sleep apnea   . Hyperlipidemia     Past Surgical History  Procedure Laterality Date  . Tonsilectomy, adenoidectomy, bilateral myringotomy and tubes      Family History  Problem Relation Age of Onset  . Hypertension Mother   . Gout Mother   . Cancer Father   . COPD Father   . Heart disease Brother     just his oldest brother  . Diabetes Neg Hx     Social History   Social History  . Marital Status: Married    Spouse Name: N/A  . Number of Children: 4  . Years of Education: N/A   Occupational History  . [employer] examiner     Retired   Social History Main Topics  . Smoking status: Never Smoker   . Smokeless tobacco: Never Used  . Alcohol Use: No  . Drug Use: No  . Sexual Activity: Not on file   Other Topics Concern    . Not on file   Social History Narrative   Review of Systems  Sleeps well Appetite is fine Weight down 7# since last visit No problems with statin     Objective:   Physical Exam  Constitutional: He appears well-developed and well-nourished. No distress.  Neck: Normal range of motion. Neck supple. No thyromegaly present.  Cardiovascular: Normal rate, regular rhythm, normal heart sounds and intact distal pulses.  Exam reveals no gallop.   No murmur heard. Pulmonary/Chest: Effort normal and breath sounds normal. No respiratory distress. He has no wheezes. He has no rales.  Musculoskeletal: He exhibits no edema or tenderness.  Lymphadenopathy:    He has no cervical adenopathy.  Skin: No rash noted. No erythema.  No foot lesions  Psychiatric: He has a normal mood and affect. His behavior is normal.          Assessment & Plan:

## 2015-11-15 NOTE — Assessment & Plan Note (Signed)
Seems to have excellent control Will check A1c 

## 2015-11-15 NOTE — Progress Notes (Signed)
Pre visit review using our clinic review tool, if applicable. No additional management support is needed unless otherwise documented below in the visit note. 

## 2015-11-16 LAB — RENAL FUNCTION PANEL
ALBUMIN: 4.3 g/dL (ref 3.5–5.2)
BUN: 17 mg/dL (ref 6–23)
CHLORIDE: 102 meq/L (ref 96–112)
CO2: 31 meq/L (ref 19–32)
Calcium: 9.9 mg/dL (ref 8.4–10.5)
Creatinine, Ser: 1.3 mg/dL (ref 0.40–1.50)
GFR: 73.95 mL/min (ref >60.00)
GLUCOSE: 104 mg/dL — AB (ref 70–99)
PHOSPHORUS: 3.3 mg/dL (ref 2.3–4.6)
POTASSIUM: 3.8 meq/L (ref 3.5–5.1)
SODIUM: 139 meq/L (ref 135–145)

## 2015-11-16 LAB — HEMOGLOBIN A1C: Hgb A1c MFr Bld: 6 % (ref 4.6–6.5)

## 2015-12-11 ENCOUNTER — Other Ambulatory Visit: Payer: Self-pay | Admitting: Internal Medicine

## 2016-04-18 ENCOUNTER — Encounter: Payer: Self-pay | Admitting: Internal Medicine

## 2016-04-18 ENCOUNTER — Ambulatory Visit (INDEPENDENT_AMBULATORY_CARE_PROVIDER_SITE_OTHER): Payer: BC Managed Care – PPO | Admitting: Internal Medicine

## 2016-04-18 VITALS — BP 112/78 | HR 70 | Temp 97.5°F | Wt 194.8 lb

## 2016-04-18 DIAGNOSIS — M25512 Pain in left shoulder: Secondary | ICD-10-CM | POA: Diagnosis not present

## 2016-04-18 DIAGNOSIS — R202 Paresthesia of skin: Secondary | ICD-10-CM

## 2016-04-18 MED ORDER — PREDNISONE 10 MG PO TABS: ORAL_TABLET | ORAL | 0 refills | Status: DC

## 2016-04-18 NOTE — Patient Instructions (Signed)
Shoulder Range of Motion Exercises Shoulder range of motion (ROM) exercises are designed to keep the shoulder moving freely. They are often recommended for people who have shoulder pain. MOVEMENT EXERCISE When you are able, do this exercise 5-6 days per week, or as told by your health care provider. Work toward doing 2 sets of 10 swings. Pendulum Exercise How To Do This Exercise Lying Down 1. Lie face-down on a bed with your abdomen close to the side of the bed. 2. Let your arm hang over the side of the bed. 3. Relax your shoulder, arm, and hand. 4. Slowly and gently swing your arm forward and back. Do not use your neck muscles to swing your arm. They should be relaxed. If you are struggling to swing your arm, have someone gently swing it for you. When you do this exercise for the first time, swing your arm at a 15 degree angle for 15 seconds, or swing your arm 10 times. As pain lessens over time, increase the angle of the swing to 30-45 degrees. 5. Repeat steps 1-4 with the other arm. How To Do This Exercise While Standing 1. Stand next to a sturdy chair or table and hold on to it with your hand.  Bend forward at the waist.  Bend your knees slightly.  Relax your other arm and let it hang limp.  Relax the shoulder blade of the arm that is hanging and let it drop.  While keeping your shoulder relaxed, use body motion to swing your arm in small circles. The first time you do this exercise, swing your arm for about 30 seconds or 10 times. When you do it next time, swing your arm for a little longer.  Stand up tall and relax.  Repeat steps 1-7, this time changing the direction of the circles. 2. Repeat steps 1-8 with the other arm. STRETCHING EXERCISES Do these exercises 3-4 times per day on 5-6 days per week or as told by your health care provider. Work toward holding the stretch for 20 seconds. Stretching Exercise 1 1. Lift your arm straight out in front of you. 2. Bend your arm 90  degrees at the elbow (right angle) so your forearm goes across your body and looks like the letter "L." 3. Use your other arm to gently pull the elbow forward and across your body. 4. Repeat steps 1-3 with the other arm. Stretching Exercise 2 You will need a towel or rope for this exercise. 1. Bend one arm behind your back with the palm facing outward. 2. Hold a towel with your other hand. 3. Reach the arm that holds the towel above your head, and bend that arm at the elbow. Your wrist should be behind your neck. 4. Use your free hand to grab the free end of the towel. 5. With the higher hand, gently pull the towel up behind you. 6. With the lower hand, pull the towel down behind you. 7. Repeat steps 1-6 with the other arm. STRENGTHENING EXERCISES Do each of these exercises at four different times of day (sessions) every day or as told by your health care provider. To begin with, repeat each exercise 5 times (repetitions). Work toward doing 3 sets of 12 repetitions or as told by your health care provider. Strengthening Exercise 1 You will need a light weight for this activity. As you grow stronger, you may use a heavier weight. 1. Standing with a weight in your hand, lift your arm straight out to the side   until it is at the same height as your shoulder. 2. Bend your arm at 90 degrees so that your fingers are pointing to the ceiling. 3. Slowly raise your hand until your arm is straight up in the air. 4. Repeat steps 1-3 with the other arm. Strengthening Exercise 2 You will need a light weight for this activity. As you grow stronger, you may use a heavier weight. 1. Standing with a weight in your hand, gradually move your straight arm in an arc, starting at your side, then out in front of you, then straight up over your head. 2. Gradually move your other arm in an arc, starting at your side, then out in front of you, then straight up over your head. 3. Repeat steps 1-2 with the other  arm. Strengthening Exercise 3 You will need an elastic band for this activity. As you grow stronger, gradually increase the size of the bands or increase the number of bands that you use at one time. 1. While standing, hold an elastic band in one hand and raise that arm up in the air. 2. With your other hand, pull down the band until that hand is by your side. 3. Repeat steps 1-2 with the other arm.   This information is not intended to replace advice given to you by your health care provider. Make sure you discuss any questions you have with your health care provider.   Document Released: 06/03/2003 Document Revised: 01/19/2015 Document Reviewed: 08/31/2014 Elsevier Interactive Patient Education 2016 [employer]  

## 2016-04-18 NOTE — Progress Notes (Signed)
Subjective:    Patient ID: Kevin Elliott, male    DOB: Apr 27, 1961, 55 y.o.   MRN: [id number]  HPI  Pt presents to the clinic today with c/o left shoulder pain. He reports this started 2 weeks ago. He describes the pain as constant achy, but it can be sharp with certain movements. The pain seems worse when his arm is hanging down by his side or when he lays on it. It feels better when he lays his arm across his chest. He does have intermittent numbness and tingling radiating down to his fingers. He denies any injury to the area but was outside doing some light yardwork prior to onset. He has taken Tylenol, Ibuprofen and Aspercream with minimal relief.  Review of Systems      Past Medical History:  Diagnosis Date  . Diabetes mellitus type 2, controlled, without complications (HCC)   . Hyperlipidemia   . Hypothyroidism   . Sleep apnea     Current Outpatient Prescriptions  Medication Sig Dispense Refill  . atorvastatin (LIPITOR) 10 MG tablet Take 1 tablet (10 mg total) by mouth daily. 90 tablet 3  . Cholecalciferol (VITAMIN D) 2000 UNITS CAPS Take by mouth. Take 2 by mouth daily    . JANUMET 50-500 MG tablet TAKE 1 TABLET BY MOUTH TWICE DAILY WITH A MEAL 60 tablet 5  . SYNTHROID 100 MCG tablet TAKE 1 TABLET BY MOUTH DAILY BEFORE BREAKFAST 120 tablet 1  . predniSONE (DELTASONE) 10 MG tablet Take 3 tabs on days 1-3, take 2 tabs on days 4-6, take 1 tab on days 7-9 18 tablet 0   No current facility-administered medications for this visit.     No Known Allergies  Family History  Problem Relation Age of Onset  . Hypertension Mother   . Gout Mother   . Cancer Father   . COPD Father   . Heart disease Brother     just his oldest brother  . Diabetes Neg Hx     Social History   Social History  . Marital status: Married    Spouse name: N/A  . Number of children: 4  . Years of education: N/A   Occupational History  . [employer] examiner     Retired   Social History Main  Topics  . Smoking status: Never Smoker  . Smokeless tobacco: Never Used  . Alcohol use No  . Drug use: No  . Sexual activity: Not on file   Other Topics Concern  . Not on file   Social History Narrative  . No narrative on file     Constitutional: Denies fever, malaise, fatigue, headache or abrupt weight changes.  Musculoskeletal: Pt reports left shoulder pain. Denies decrease in range of motion, difficulty with gait, muscle pain or joint swelling.  Skin: Denies redness, rashes, lesions or ulcercations.  Neurological: Pt reports numbness and tingling in his left arm. Denies dizziness, difficulty with memory, difficulty with speech or problems with balance and coordination.    No other specific complaints in a complete review of systems (except as listed in HPI above).  Objective:   Physical Exam  BP 112/78   Pulse 70   Temp 97.5 F (36.4 C) (Oral)   Wt 194 lb 12 oz (88.3 kg)   SpO2 98%   BMI 32.41 kg/m  Wt Readings from Last 3 Encounters:  04/18/16 194 lb 12 oz (88.3 kg)  11/15/15 197 lb (89.4 kg)  05/13/15 204 lb (92.5 kg)  General: Appears  His stated age, well developed, well nourished in NAD. Cardiovascular: Radial pulses 2+. Musculoskeletal: Normal flexion, extension and rotation of the cervical spine. Pain with rotation to the left. No bony tenderness noted over the cervical spine. Normal internal, external rotation of the left shoulder. No pain with palpation of the shoulder joint. Pain with palpation of the biceps tendon. Strength 5/5 BUE, hand grips equal. Neurological: Alert and oriented. Sensation intact to BUE.  BMET    Component Value Date/Time   NA 139 11/15/2015 1641   K 3.8 11/15/2015 1641   CL 102 11/15/2015 1641   CO2 31 11/15/2015 1641   GLUCOSE 104 (H) 11/15/2015 1641   BUN 17 11/15/2015 1641   CREATININE 1.30 11/15/2015 1641   CALCIUM 9.9 11/15/2015 1641   GFRNONAA 69 (L) 03/30/2014 1436   GFRAA 80 (L) 03/30/2014 1436    Lipid Panel      Component Value Date/Time   CHOL 118 07/02/2015 1007   TRIG 52.0 07/02/2015 1007   HDL 37.30 (L) 07/02/2015 1007   CHOLHDL 3 07/02/2015 1007   VLDL 10.4 07/02/2015 1007   LDLCALC 70 07/02/2015 1007    CBC    Component Value Date/Time   WBC 4.5 11/10/2014 1256   RBC 5.32 11/10/2014 1256   HGB 15.8 11/10/2014 1256   HCT 46.8 11/10/2014 1256   PLT 196.0 11/10/2014 1256   MCV 88.1 11/10/2014 1256   MCH 29.7 03/30/2014 1436   MCHC 33.8 11/10/2014 1256   RDW 14.3 11/10/2014 1256   LYMPHSABS 2.5 11/10/2014 1256   MONOABS 0.4 11/10/2014 1256   EOSABS 0.1 11/10/2014 1256   BASOSABS 0.0 11/10/2014 1256    Hgb A1C Lab Results  Component Value Date   HGBA1C 6.0 11/15/2015            Assessment & Plan:   Left shoulder pain, paresthesia of left arm:  Likely biceps tendonitis He asked for xray, advised him to hold off at this time eRx for Pred Taper x 9 days- monitor blood sugars (DM 2) Stretching exercises given Sling given for comfort If persist or worsens, consider MRI  RTC as needed Nicki Reaper, NP

## 2016-05-02 ENCOUNTER — Encounter: Payer: Self-pay | Admitting: Internal Medicine

## 2016-05-02 ENCOUNTER — Ambulatory Visit (INDEPENDENT_AMBULATORY_CARE_PROVIDER_SITE_OTHER): Payer: BC Managed Care – PPO | Admitting: Internal Medicine

## 2016-05-02 DIAGNOSIS — S46312A Strain of muscle, fascia and tendon of triceps, left arm, initial encounter: Secondary | ICD-10-CM | POA: Insufficient documentation

## 2016-05-02 DIAGNOSIS — S46312D Strain of muscle, fascia and tendon of triceps, left arm, subsequent encounter: Secondary | ICD-10-CM | POA: Diagnosis not present

## 2016-05-02 NOTE — Progress Notes (Signed)
   Subjective:    Patient ID: Kevin Elliott, male    DOB: 04-Mar-1961, 55 y.o.   MRN: [id number]  HPI Here for follow up of shoulder problems Reviewed visit from 2 weeks  Still with pain--though not as severe If he pulls his left arm across his chest--he gets puling and burning down arm laterally Soreness bad with any prolonged holding of weight Soreness mostly posterior and lateral No problems lifting shoulder Only a problem when he hand is hanging down  Did take the steroids Using ibuprofen 200mg  daily or bid at most Rubbed it down with aspercreme Tingling/numbness if lying on that side too long  Current Outpatient Prescriptions on File Prior to Visit  Medication Sig Dispense Refill  . atorvastatin (LIPITOR) 10 MG tablet Take 1 tablet (10 mg total) by mouth daily. 90 tablet 3  . Cholecalciferol (VITAMIN D) 2000 UNITS CAPS Take by mouth. Take 2 by mouth daily    . JANUMET 50-500 MG tablet TAKE 1 TABLET BY MOUTH TWICE DAILY WITH A MEAL 60 tablet 5  . SYNTHROID 100 MCG tablet TAKE 1 TABLET BY MOUTH DAILY BEFORE BREAKFAST 120 tablet 1   No current facility-administered medications on file prior to visit.     No Known Allergies  Past Medical History:  Diagnosis Date  . Diabetes mellitus type 2, controlled, without complications (HCC)   . Hyperlipidemia   . Hypothyroidism   . Sleep apnea     Past Surgical History:  Procedure Laterality Date  . TONSILECTOMY, ADENOIDECTOMY, BILATERAL MYRINGOTOMY AND TUBES      Family History  Problem Relation Age of Onset  . Hypertension Mother   . Gout Mother   . Cancer Father   . COPD Father   . Heart disease Brother     just his oldest brother  . Diabetes Neg Hx     Social History   Social History  . Marital status: Married    Spouse name: N/A  . Number of children: 4  . Years of education: N/A   Occupational History  . 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 Smoking status: Never Smoker  .  Smokeless tobacco: Never Used  . Alcohol use No  . Drug use: No  . Sexual activity: Not on file   Other Topics Concern  . Not on file   Social History Narrative  . No narrative on file   Review of Systems No known injury No weight lifting--only typical yard work    Objective:   Physical Exam  Musculoskeletal:  Left shoulder--no swelling Normal active ROM No crepitus Tender at insertion of triceps No arm weakness No bursa tenderness          Assessment & Plan:

## 2016-05-02 NOTE — Patient Instructions (Signed)
Please try ice on the painful area regularly over the next 1-2 weeks. Try ibuprofen 400mg  (2 tabs) three times a day for the next 1-2 weeks If you are no better in 2-3 weeks, I will set you up with an orthopedist.

## 2016-05-02 NOTE — Progress Notes (Signed)
Pre visit review using our clinic review tool, if applicable. No additional management support is needed unless otherwise documented below in the visit note. 

## 2016-05-02 NOTE — Assessment & Plan Note (Signed)
Ongoing pain and trouble using the arm Has clear tendonitis but no loss of function Discussed ice, NSAID regularly for 1-2 weeks and avoid upper body weights during that time

## 2016-06-07 ENCOUNTER — Other Ambulatory Visit: Payer: Self-pay | Admitting: Internal Medicine

## 2016-06-12 ENCOUNTER — Encounter: Payer: BC Managed Care – PPO | Admitting: Internal Medicine

## 2016-06-13 ENCOUNTER — Encounter: Payer: Self-pay | Admitting: Internal Medicine

## 2016-06-13 ENCOUNTER — Ambulatory Visit (INDEPENDENT_AMBULATORY_CARE_PROVIDER_SITE_OTHER): Payer: BC Managed Care – PPO | Admitting: Internal Medicine

## 2016-06-13 VITALS — BP 108/70 | HR 73 | Temp 97.4°F | Ht 66.0 in | Wt 195.2 lb

## 2016-06-13 DIAGNOSIS — E119 Type 2 diabetes mellitus without complications: Secondary | ICD-10-CM

## 2016-06-13 DIAGNOSIS — Z0000 Encounter for general adult medical examination without abnormal findings: Secondary | ICD-10-CM | POA: Diagnosis not present

## 2016-06-13 DIAGNOSIS — Z23 Encounter for immunization: Secondary | ICD-10-CM

## 2016-06-13 DIAGNOSIS — E039 Hypothyroidism, unspecified: Secondary | ICD-10-CM

## 2016-06-13 DIAGNOSIS — Z125 Encounter for screening for malignant neoplasm of prostate: Secondary | ICD-10-CM

## 2016-06-13 DIAGNOSIS — E785 Hyperlipidemia, unspecified: Secondary | ICD-10-CM

## 2016-06-13 LAB — COMPREHENSIVE METABOLIC PANEL
ALT: 22 U/L (ref 0–53)
AST: 23 U/L (ref 0–37)
Albumin: 4 g/dL (ref 3.5–5.2)
Alkaline Phosphatase: 55 U/L (ref 39–117)
BILIRUBIN TOTAL: 0.6 mg/dL (ref 0.2–1.2)
BUN: 16 mg/dL (ref 6–23)
CHLORIDE: 103 meq/L (ref 96–112)
CO2: 33 meq/L — AB (ref 19–32)
CREATININE: 1.32 mg/dL (ref 0.40–1.50)
Calcium: 9.5 mg/dL (ref 8.4–10.5)
GFR: 72.5 mL/min (ref >60.00)
Glucose, Bld: 78 mg/dL (ref 70–99)
Potassium: 3.9 mEq/L (ref 3.5–5.1)
SODIUM: 142 meq/L (ref 135–145)
Total Protein: 6.7 g/dL (ref 6.0–8.3)

## 2016-06-13 LAB — CBC WITH DIFFERENTIAL/PLATELET
BASOS ABS: 0 10*3/uL (ref 0.0–0.1)
BASOS PCT: 0.4 % (ref 0.0–3.0)
EOS ABS: 0.2 10*3/uL (ref 0.0–0.7)
Eosinophils Relative: 3.3 % (ref 0.0–5.0)
HEMATOCRIT: 45.2 % (ref 39.0–52.0)
Hemoglobin: 15.3 g/dL (ref 13.0–17.0)
LYMPHS PCT: 54.7 % — AB (ref 12.0–46.0)
Lymphs Abs: 2.5 10*3/uL (ref 0.7–4.0)
MCHC: 33.9 g/dL (ref 30.0–36.0)
MCV: 88.6 fl (ref 78.0–100.0)
MONO ABS: 0.5 10*3/uL (ref 0.1–1.0)
Monocytes Relative: 11.5 % (ref 3.0–12.0)
NEUTROS ABS: 1.4 10*3/uL (ref 1.4–7.7)
Neutrophils Relative %: 30.1 % — ABNORMAL LOW (ref 43.0–77.0)
PLATELETS: 173 10*3/uL (ref 150.0–400.0)
RBC: 5.11 Mil/uL (ref 4.22–5.81)
RDW: 14.5 % (ref 11.5–15.5)
WBC: 4.6 10*3/uL (ref 4.0–10.5)

## 2016-06-13 LAB — HEMOGLOBIN A1C: HEMOGLOBIN A1C: 6.1 % (ref 4.6–6.5)

## 2016-06-13 LAB — T4, FREE: Free T4: 0.81 ng/dL (ref 0.60–1.60)

## 2016-06-13 LAB — PSA: PSA: 0.79 ng/mL (ref 0.10–4.00)

## 2016-06-13 LAB — LIPID PANEL
CHOL/HDL RATIO: 3
CHOLESTEROL: 110 mg/dL (ref 0–200)
HDL: 42.2 mg/dL (ref >39.00)
LDL Cholesterol: 49 mg/dL (ref 0–99)
NONHDL: 67.39
Triglycerides: 92 mg/dL (ref 0.0–149.0)
VLDL: 18.4 mg/dL (ref 0.0–40.0)

## 2016-06-13 LAB — MICROALBUMIN / CREATININE URINE RATIO
CREATININE, U: 100.4 mg/dL
MICROALB/CREAT RATIO: 0.7 mg/g (ref 0.0–30.0)

## 2016-06-13 LAB — TSH: TSH: 2.69 u[IU]/mL (ref 0.35–4.50)

## 2016-06-13 NOTE — Addendum Note (Signed)
Addended by: Eual FinesBRIDGES, Maecyn Panning P on: 06/13/2016 12:44 PM   Modules accepted: Orders

## 2016-06-13 NOTE — Assessment & Plan Note (Signed)
Seems euthyroid 

## 2016-06-13 NOTE — Assessment & Plan Note (Signed)
Sugars are in normal range without hypoglycemia Will recheck labs

## 2016-06-13 NOTE — Progress Notes (Signed)
Subjective:    Patient ID: Kevin Elliott, male    DOB: 1961-05-28, 55 y.o.   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 atorvastatin (LIPITOR) 10 MG tablet TAKE 1 TABLET BY MOUTH DAILY 90 tablet 0  . Cholecalciferol (VITAMIN D) 2000 UNITS CAPS Take by mouth. Take 2 by mouth daily    . JANUMET 50-500 MG tablet TAKE 1 TABLET BY MOUTH TWICE DAILY WITH A MEAL 60 tablet 5  . SYNTHROID 100 MCG tablet TAKE 1 TABLET BY MOUTH DAILY BEFORE BREAKFAST 120 tablet 1   No current facility-administered medications on file prior to visit.     No Known Allergies  Past Medical History:  Diagnosis Date  . Diabetes mellitus type 2, controlled, without complications (HCC)   . Hyperlipidemia   . Hypothyroidism   . Sleep apnea     Past Surgical History:  Procedure Laterality Date  . TONSILECTOMY, ADENOIDECTOMY, BILATERAL MYRINGOTOMY AND TUBES      Family History  Problem Relation Age of Onset  . Hypertension Mother   . Gout Mother   . Cancer Father   . COPD Father   . Heart disease Brother     just his oldest brother  . Diabetes Neg Hx     Social History   Social History  . Marital status: Married    Spouse name: N/A  . Number of children: 4  . Years of education: N/A   Occupational History  . 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 Smoking status: Never Smoker  . Smokeless tobacco: Never Used  . Alcohol use No  . Drug use: No  . Sexual activity: Not on file   Other Topics Concern  . Not on file   Social History Narrative  . No narrative on file     Review of Systems  Constitutional:       Has lost some weight Walks a lot at work Wears seat belt  HENT: Negative for dental problem, hearing loss and tinnitus.        Keeps up with dentist  Eyes: Negative for visual disturbance.       No diplopia or unilateral vision loss  Respiratory: Negative for cough, chest tightness and shortness of breath.   Cardiovascular: Negative for chest pain, palpitations and leg swelling.  Gastrointestinal: Negative for abdominal pain, blood in stool, constipation, nausea and vomiting.       No heartburn  Endocrine: Negative for polydipsia and polyuria.  Genitourinary:       Slight dribbling but flow is okay Intermittent nocturia--not regular No sexual problems  Musculoskeletal: Negative for arthralgias, back pain and joint swelling.       Left shoulder bursitis resolved  Skin: Negative for rash.       No suspicious lesions  Allergic/Immunologic: Negative for environmental allergies and immunocompromised state.  Neurological: Negative for dizziness, syncope, weakness and light-headedness.  Hematological: Negative for adenopathy. Does not bruise/bleed easily.  Psychiatric/Behavioral: Negative for dysphoric mood and sleep disturbance. The patient is not nervous/anxious.  Objective:   Physical Exam  Constitutional: He is oriented to person, place, and time. He appears well-developed and well-nourished. No distress.  HENT:  Head: Normocephalic and atraumatic.  Right Ear: External ear normal.  Left Ear: External ear normal.  Mouth/Throat: Oropharynx is clear and moist. No oropharyngeal exudate.  Eyes: Conjunctivae are normal. Pupils are equal, round, and reactive to light.  Neck: Normal range of motion. Neck supple. No thyromegaly present.  Cardiovascular: Normal rate, regular rhythm, normal heart sounds and intact distal pulses.   No murmur heard. Pulmonary/Chest: Effort normal and breath sounds normal. No respiratory distress. He has no  wheezes. He has no rales.  Abdominal: Soft. There is no tenderness.  Musculoskeletal: He exhibits no edema or tenderness.  Lymphadenopathy:    He has no cervical adenopathy.  Neurological: He is alert and oriented to person, place, and time.  Normal sensation in feet  Skin: No rash noted. No erythema.  No foot lesions  Psychiatric: He has a normal mood and affect. His behavior is normal.          Assessment & Plan:

## 2016-06-13 NOTE — Progress Notes (Signed)
Pre visit review using our clinic review tool, if applicable. No additional management support is needed unless otherwise documented below in the visit note. 

## 2016-06-13 NOTE — Assessment & Plan Note (Signed)
No problems with statin 

## 2016-06-13 NOTE — Assessment & Plan Note (Signed)
Will check PSA after discussion Colon due next year Working on fitness Flu vaccine today

## 2016-08-05 ENCOUNTER — Other Ambulatory Visit: Payer: Self-pay | Admitting: Internal Medicine

## 2016-09-09 ENCOUNTER — Other Ambulatory Visit: Payer: Self-pay | Admitting: Internal Medicine

## 2016-12-04 ENCOUNTER — Other Ambulatory Visit: Payer: Self-pay | Admitting: Internal Medicine

## 2016-12-04 MED ORDER — ATORVASTATIN CALCIUM 10 MG PO TABS: 10.0000 mg | ORAL_TABLET | Freq: Every day | ORAL | 1 refills | Status: DC

## 2016-12-12 ENCOUNTER — Ambulatory Visit: Payer: BC Managed Care – PPO | Admitting: Internal Medicine

## 2017-01-03 ENCOUNTER — Ambulatory Visit (INDEPENDENT_AMBULATORY_CARE_PROVIDER_SITE_OTHER): Payer: BC Managed Care – PPO | Admitting: Internal Medicine

## 2017-01-03 ENCOUNTER — Encounter: Payer: Self-pay | Admitting: Internal Medicine

## 2017-01-03 DIAGNOSIS — E119 Type 2 diabetes mellitus without complications: Secondary | ICD-10-CM

## 2017-01-03 LAB — HEMOGLOBIN A1C: Hgb A1c MFr Bld: 6 % (ref 4.6–6.5)

## 2017-01-03 LAB — HM DIABETES FOOT EXAM

## 2017-01-03 NOTE — Progress Notes (Signed)
   Subjective:    Patient ID: Kevin Elliott, male    DOB: 1961-04-24, 56 y.o.   MRN: [id number]  HPI Here for follow up of diabetes  Diabetes seems to be well controlled Checks every other day--- usually in 70's No hypoglycemic reactions Tries to keep up with healthy eating Tries to exercise regularly--may eat protein bar before exercise depending on when it is Still needs to set up eye appt No foot numbness, sores or pain  Current Outpatient Prescriptions on File Prior to Visit  Medication Sig Dispense Refill  . atorvastatin (LIPITOR) 10 MG tablet Take 1 tablet (10 mg total) by mouth daily. 90 tablet 1  . Cholecalciferol (VITAMIN D) 2000 UNITS CAPS Take by mouth. Take 2 by mouth daily    . JANUMET 50-500 MG tablet TAKE 1 TABLET BY MOUTH TWICE DAILY WITH A MEAL 60 tablet 4  . SYNTHROID 100 MCG tablet TAKE 1 TABLET BY MOUTH DAILY BEFORE BREAKFAST 120 tablet 2   No current facility-administered medications on file prior to visit.     No Known Allergies  Past Medical History:  Diagnosis Date  . Diabetes mellitus type 2, controlled, without complications (HCC)   . Hyperlipidemia   . Hypothyroidism   . Sleep apnea     Past Surgical History:  Procedure Laterality Date  . TONSILECTOMY, ADENOIDECTOMY, BILATERAL MYRINGOTOMY AND TUBES      Family History  Problem Relation Age of Onset  . Hypertension Mother   . Gout Mother   . Cancer Father   . COPD Father   . Heart disease Brother     just his oldest brother  . Diabetes Neg Hx     Social History   Social History  . Marital status: Married    Spouse name: N/A  . Number of children: 4  . Years of education: N/A   Occupational History  . [employer] examiner     Retired  . 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 Smoking status: Never Smoker  . Smokeless tobacco: Never Used  . Alcohol use No  . Drug use: No  . Sexual activity: Not on file   Other Topics Concern  . Not on file    Social History Narrative  . No narrative on file   Review of Systems  No chest pain No SOB Sleeps well Did put on 11#---winter thing     Objective:   Physical Exam  Constitutional: He appears well-nourished. No distress.  Neck: No thyromegaly present.  Cardiovascular: Normal rate, regular rhythm, normal heart sounds and intact distal pulses.  Exam reveals no gallop.   No murmur heard. Pulmonary/Chest: Effort normal and breath sounds normal. No respiratory distress. He has no wheezes. He has no rales.  Abdominal: Soft. There is no tenderness.  Musculoskeletal: He exhibits no edema or tenderness.  Lymphadenopathy:    He has no cervical adenopathy.  Neurological:  Normal sensation in feet  Skin:  No foot lesions  Psychiatric: He has a normal mood and affect. His behavior is normal.          Assessment & Plan:

## 2017-01-03 NOTE — Assessment & Plan Note (Signed)
Still seems to have excellent control Discussed winter fitness Will check A1c

## 2017-01-03 NOTE — Patient Instructions (Signed)
Please get your diabetic eye exam!! 

## 2017-01-03 NOTE — Progress Notes (Signed)
Pre visit review using our clinic review tool, if applicable. No additional management support is needed unless otherwise documented below in the visit note. 

## 2017-02-21 LAB — HM DIABETES EYE EXAM

## 2017-02-23 ENCOUNTER — Encounter: Payer: Self-pay | Admitting: Internal Medicine

## 2017-03-10 ENCOUNTER — Other Ambulatory Visit: Payer: Self-pay | Admitting: Internal Medicine

## 2017-06-09 ENCOUNTER — Other Ambulatory Visit: Payer: Self-pay | Admitting: Internal Medicine

## 2017-07-31 ENCOUNTER — Encounter: Payer: BC Managed Care – PPO | Admitting: Internal Medicine

## 2017-10-11 ENCOUNTER — Other Ambulatory Visit: Payer: Self-pay | Admitting: Internal Medicine

## 2017-11-09 ENCOUNTER — Other Ambulatory Visit: Payer: Self-pay | Admitting: Internal Medicine

## 2017-11-10 ENCOUNTER — Other Ambulatory Visit: Payer: Self-pay | Admitting: Internal Medicine

## 2017-11-14 ENCOUNTER — Other Ambulatory Visit: Payer: Self-pay | Admitting: Internal Medicine

## 2017-12-15 ENCOUNTER — Other Ambulatory Visit: Payer: Self-pay | Admitting: Internal Medicine

## 2017-12-19 ENCOUNTER — Other Ambulatory Visit: Payer: Self-pay | Admitting: Internal Medicine

## 2018-02-02 ENCOUNTER — Other Ambulatory Visit: Payer: Self-pay | Admitting: Internal Medicine

## 2018-02-04 NOTE — Telephone Encounter (Signed)
Spoke to pt. He has moved to [place] and has not found a PCP. I advised him we could give him 1 more month of refills, but would have to see him since it has been over a year. He will find a PCP this week.

## 2018-02-14 ENCOUNTER — Other Ambulatory Visit: Payer: Self-pay | Admitting: Internal Medicine

## 2018-03-18 ENCOUNTER — Telehealth: Payer: Self-pay

## 2018-03-18 NOTE — Telephone Encounter (Signed)
Left detailed message on vm per dpr that I need to know how he takes his atorvastatin that was added in September 2018 so that it can be properly documented.  PEC, if pt calls back, please find out how he takes his atorvastatin.
